# Patient Record
Sex: Male | Born: 2005
Health system: Southern US, Community
[De-identification: ages and names within clinical notes are randomized; demographics above are authoritative.]

## PROBLEM LIST (undated history)

## (undated) DIAGNOSIS — G43909 Migraine, unspecified, not intractable, without status migrainosus: Secondary | ICD-10-CM

## (undated) DIAGNOSIS — R519 Headache, unspecified: Secondary | ICD-10-CM

## (undated) HISTORY — PX: OTHER SURGICAL HISTORY: SHX169

---
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
## 2024-09-25 ENCOUNTER — Other Ambulatory Visit: Payer: Self-pay

## 2024-09-25 ENCOUNTER — Emergency Department (HOSPITAL_COMMUNITY)
Admission: EM | Admit: 2024-09-25 | Discharge: 2024-09-26 | Disposition: A | Discharge status: Home / Self Care | Attending: Emergency Medicine | Admitting: Emergency Medicine

## 2024-09-25 ENCOUNTER — Encounter (HOSPITAL_COMMUNITY): Payer: Self-pay

## 2024-09-25 DIAGNOSIS — Z23 Encounter for immunization: Secondary | ICD-10-CM | POA: Insufficient documentation

## 2024-09-25 DIAGNOSIS — Y92410 Unspecified street and highway as the place of occurrence of the external cause: Secondary | ICD-10-CM | POA: Diagnosis not present

## 2024-09-25 DIAGNOSIS — S0990XA Unspecified injury of head, initial encounter: Secondary | ICD-10-CM

## 2024-09-25 DIAGNOSIS — S01511A Laceration without foreign body of lip, initial encounter: Secondary | ICD-10-CM | POA: Insufficient documentation

## 2024-09-25 LAB — I-STAT CHEM 8, ED
BUN: <3 mg/dL — ABNORMAL LOW (ref 6–20)
Calcium, Ion: 1.09 mmol/L — ABNORMAL LOW (ref 1.15–1.40)
Chloride: 107 mmol/L (ref 98–111)
Creatinine, Ser: 0.9 mg/dL (ref 0.61–1.24)
Glucose, Bld: 88 mg/dL (ref 70–99)
HCT: 49 % (ref 39.0–52.0)
Hemoglobin: 16.7 g/dL (ref 13.0–17.0)
Potassium: 3.1 mmol/L — ABNORMAL LOW (ref 3.5–5.1)
Sodium: 147 mmol/L — ABNORMAL HIGH (ref 135–145)
TCO2: 24 mmol/L (ref 22–32)

## 2024-09-25 LAB — SAMPLE TO BLOOD BANK

## 2024-09-25 LAB — COMPREHENSIVE METABOLIC PANEL WITH GFR
ALT: 16 U/L (ref 0–44)
AST: 27 U/L (ref 15–41)
Albumin: 4.3 g/dL (ref 3.5–5.0)
Alkaline Phosphatase: 79 U/L (ref 38–126)
Anion gap: 14 (ref 5–15)
BUN: <5 mg/dL — ABNORMAL LOW (ref 6–20)
CO2: 23 mmol/L (ref 22–32)
Calcium: 9.4 mg/dL (ref 8.9–10.3)
Chloride: 108 mmol/L (ref 98–111)
Creatinine, Ser: 0.73 mg/dL (ref 0.61–1.24)
GFR, Estimated: >60 mL/min (ref >60)
Glucose, Bld: 91 mg/dL (ref 70–99)
Potassium: 3 mmol/L — ABNORMAL LOW (ref 3.5–5.1)
Sodium: 145 mmol/L (ref 135–145)
Total Bilirubin: 0.5 mg/dL (ref 0.0–1.2)
Total Protein: 8.1 g/dL (ref 6.5–8.1)

## 2024-09-25 LAB — URINALYSIS, ROUTINE W REFLEX MICROSCOPIC
Bacteria, UA: NONE SEEN
Bilirubin Urine: NEGATIVE
Glucose, UA: NEGATIVE mg/dL
Hgb urine dipstick: NEGATIVE
Ketones, ur: NEGATIVE mg/dL
Nitrite: NEGATIVE
Protein, ur: NEGATIVE mg/dL
Specific Gravity, Urine: 1.001 — ABNORMAL LOW (ref 1.005–1.030)
pH: 7 (ref 5.0–8.0)

## 2024-09-25 LAB — CBC
HCT: 48.3 % (ref 39.0–52.0)
Hemoglobin: 16.4 g/dL (ref 13.0–17.0)
MCH: 31.9 pg (ref 26.0–34.0)
MCHC: 34 g/dL (ref 30.0–36.0)
MCV: 94 fL (ref 80.0–100.0)
Platelets: 255 K/uL (ref 150–400)
RBC: 5.14 MIL/uL (ref 4.22–5.81)
RDW: 12.8 % (ref 11.5–15.5)
WBC: 6.7 K/uL (ref 4.0–10.5)
nRBC: 0 % (ref 0.0–0.2)

## 2024-09-25 LAB — PROTIME-INR
INR: 1 (ref 0.8–1.2)
Prothrombin Time: 13.3 s (ref 11.4–15.2)

## 2024-09-25 LAB — ETHANOL: Alcohol, Ethyl (B): 263 mg/dL — ABNORMAL HIGH (ref <15)

## 2024-09-25 LAB — I-STAT CG4 LACTIC ACID, ED
Lactic Acid, Venous: 1.8 mmol/L (ref 0.5–1.9)
Lactic Acid, Venous: 2.9 mmol/L (ref 0.5–1.9)

## 2024-09-25 MED ORDER — LIDOCAINE HCL (PF) 1 % IJ SOLN
30.0000 mL | Freq: Once | INTRAMUSCULAR | Status: AC
  Administered 2024-09-25: 30 mL
  Filled 2024-09-25: qty 30

## 2024-09-25 MED ORDER — ONDANSETRON HCL 4 MG/2ML IJ SOLN
4.0000 mg | Freq: Once | INTRAMUSCULAR | Status: AC
  Administered 2024-09-25: 4 mg via INTRAVENOUS
  Filled 2024-09-25: qty 2

## 2024-09-25 MED ORDER — POTASSIUM CHLORIDE CRYS ER 20 MEQ PO TBCR
40.0000 meq | EXTENDED_RELEASE_TABLET | Freq: Once | ORAL | Status: AC
  Administered 2024-09-25: 40 meq via ORAL
  Filled 2024-09-25: qty 2

## 2024-09-25 MED ORDER — MELOXICAM 7.5 MG PO TABS: 7.5000 mg | ORAL_TABLET | Freq: Every day | ORAL | 0 refills | Status: AC

## 2024-09-25 MED ORDER — TETANUS-DIPHTH-ACELL PERTUSSIS 5-2-15.5 LF-MCG/0.5 IM SUSP
0.5000 mL | Freq: Once | INTRAMUSCULAR | Status: AC
  Administered 2024-09-25: 0.5 mL via INTRAMUSCULAR

## 2024-09-25 MED ORDER — ACETAMINOPHEN ER 650 MG PO TBCR: 650.0000 mg | EXTENDED_RELEASE_TABLET | Freq: Three times a day (TID) | ORAL | 0 refills | Status: AC | PRN

## 2024-09-25 MED ORDER — FENTANYL CITRATE (PF) 50 MCG/ML IJ SOSY
50.0000 ug | PREFILLED_SYRINGE | Freq: Once | INTRAMUSCULAR | Status: AC
  Administered 2024-09-25: 50 ug via INTRAVENOUS
  Filled 2024-09-25: qty 1

## 2024-09-25 MED ORDER — IOHEXOL 350 MG/ML SOLN
75.0000 mL | Freq: Once | INTRAVENOUS | Status: AC | PRN
  Administered 2024-09-25: 75 mL via INTRAVENOUS

## 2024-09-25 MED ORDER — LACTATED RINGERS IV BOLUS
1000.0000 mL | Freq: Once | INTRAVENOUS | Status: AC
  Administered 2024-09-25: 1000 mL via INTRAVENOUS
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
## 2024-09-25 NOTE — ED Notes (Signed)
Mother and other family at bedside.
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
## 2024-09-29 ENCOUNTER — Encounter (INDEPENDENT_AMBULATORY_CARE_PROVIDER_SITE_OTHER): Payer: Self-pay | Admitting: Neurology

## 2024-10-02 ENCOUNTER — Emergency Department (HOSPITAL_COMMUNITY): Admission: EM | Admit: 2024-10-02 | Discharge: 2024-10-02 | Disposition: A | Discharge status: Home / Self Care

## 2024-10-02 ENCOUNTER — Other Ambulatory Visit: Payer: Self-pay

## 2024-10-02 DIAGNOSIS — Z4802 Encounter for removal of sutures: Secondary | ICD-10-CM | POA: Insufficient documentation

## 2024-10-02 NOTE — ED Provider Notes (Signed)
 Ivan Palmer   CSN: [id number] Arrival date & time: 10/02/24  1001     Patient presents with: Wound Check   Ivan Palmer is a 18 y.o. male.    Wound Check  Patient here in the emergency department 1 week after injury requiring stitches  in lower lip.  Here 1 week after injury.     Prior to Admission medications   Medication Sig Start Date End Date Taking? Authorizing Provider  acetaminophen  (TYLENOL  8 HOUR) 650 MG CR tablet Take 1 tablet (650 mg total) by mouth every 8 (eight) hours as needed for up to 10 days for pain. 09/25/24 10/05/24  Minnie Tinnie BRAVO, PA  Coenzyme Q10 (COQ10) 150 MG CAPS Take once daily 08/22/21   Corinthia Blossom, MD  Magnesium  Oxide 500 MG TABS Take 1 tablet (500 mg total) by mouth daily. 08/22/21   Corinthia Blossom, MD  meloxicam  (MOBIC ) 7.5 MG tablet Take 1 tablet (7.5 mg total) by mouth daily. 09/25/24   Minnie Tinnie BRAVO, PA  ondansetron  (ZOFRAN ) 4 MG/5ML solution Take 5 mLs (4 mg total) by mouth every 8 (eight) hours as needed for nausea or vomiting. Patient not taking: Reported on 08/22/2021 04/14/19   Cadet, Joane, MD  predniSONE  (DELTASONE ) 20 MG tablet Take 3 tablets (60 mg total) by mouth daily with breakfast. For 5 days, then 40mg  x2 days, 20mg  x2 days, 10mg  x1 day Patient not taking: Reported on 08/22/2021 03/10/21   Jarrett Lucie SAILOR, DO    Allergies: Patient has no known allergies.    Review of Systems  Updated Vital Signs BP 121/84   Pulse 76   Temp 98 F (36.7 C)   Resp 20   SpO2 100%   Physical Exam Vitals and nursing Palmer reviewed.  Constitutional:      General: He is not in acute distress.    Appearance: Normal appearance. He is not ill-appearing.  HENT:     Head: Normocephalic and atraumatic.  Eyes:     General: No scleral icterus.       Right eye: No discharge.        Left eye: No discharge.     Conjunctiva/sclera: Conjunctivae normal.  Pulmonary:      Effort: Pulmonary effort is normal.     Breath sounds: No stridor.  Skin:    General: Skin is warm and dry.     Comments: Four Prolene stitches to lower lip  Neurological:     Mental Status: He is alert and oriented to person, place, and time. Mental status is at baseline.     (all labs ordered are listed, but only abnormal results are displayed) Labs Reviewed - No data to display  EKG: None  Radiology: No results found.   Suture Removal  Date/Time: 10/02/2024 12:26 PM  Performed by: Ivan Hamp RAMAN, PA Authorized by: Ivan Hamp RAMAN, PA   Consent:    Consent obtained:  Verbal   Consent given by:  Patient   Risks discussed:  Bleeding, pain and wound separation   Alternatives discussed:  No treatment, delayed treatment, observation and referral Procedure details:    Wound appearance:  No signs of infection, nontender, good wound healing and clean   Number of sutures removed:  4 Post-procedure details:    Post-removal:  No dressing applied   Procedure completion:  Tolerated well, no immediate complications    Medications Ordered in the ED - No data to display  Medical Decision Making   Patient here in the emergency department 1 week after injury requiring stitches  in lower lip.  Here 1 week after injury.  4 stitches removed.  Patient given bacitracin and recommended to use this twice daily  Final diagnoses:  Visit for suture removal    ED Discharge Orders     None          Ivan Palmer, [place] 10/02/24 1554    Gennaro Bouchard L, DO 10/03/24 [phone]

## 2024-10-02 NOTE — Discharge Instructions (Addendum)
 Apply antibiotic ointment daily or twice daily.  Please use Tylenol  or ibuprofen  for pain.  You may use 600 mg ibuprofen  every 6 hours or 1000 mg of Tylenol  every 6 hours.  You may choose to alternate between the 2.  This would be most effective.  Not to exceed 4 g of Tylenol  within 24 hours.  Not to exceed 3200 mg ibuprofen  24 hours.

## 2024-10-02 NOTE — ED Notes (Signed)
 ED Provider at bedside.

## 2024-10-02 NOTE — ED Triage Notes (Signed)
 Pt. Stated, I need somebody to check my stitches on my mouth , they hurt. Ive tried Tylenol  and it doesn't help.
